# Patient Record
Sex: Male | Born: 1970 | Race: Black or African American | Hispanic: No | Marital: Married | Smoking: Current every day smoker
Health system: Southern US, Community
[De-identification: ages and names within clinical notes are randomized; demographics above are authoritative.]

## PROBLEM LIST (undated history)

## (undated) DIAGNOSIS — E785 Hyperlipidemia, unspecified: Secondary | ICD-10-CM

## (undated) DIAGNOSIS — G4730 Sleep apnea, unspecified: Secondary | ICD-10-CM

## (undated) HISTORY — DX: Sleep apnea, unspecified: G47.30

## (undated) HISTORY — DX: Hyperlipidemia, unspecified: E78.5

---

## 2012-09-15 ENCOUNTER — Other Ambulatory Visit (HOSPITAL_BASED_OUTPATIENT_CLINIC_OR_DEPARTMENT_OTHER): Payer: BC Managed Care – PPO

## 2012-09-15 ENCOUNTER — Ambulatory Visit (HOSPITAL_BASED_OUTPATIENT_CLINIC_OR_DEPARTMENT_OTHER): Payer: BC Managed Care – PPO

## 2012-09-15 ENCOUNTER — Ambulatory Visit (HOSPITAL_BASED_OUTPATIENT_CLINIC_OR_DEPARTMENT_OTHER)
Admission: RE | Admit: 2012-09-15 | Discharge: 2012-09-15 | Disposition: A | Discharge status: Home / Self Care | Payer: BC Managed Care – PPO | Source: Ambulatory Visit | Attending: Family Medicine | Admitting: Family Medicine

## 2012-09-15 ENCOUNTER — Ambulatory Visit (INDEPENDENT_AMBULATORY_CARE_PROVIDER_SITE_OTHER): Payer: BC Managed Care – PPO | Admitting: Family Medicine

## 2012-09-15 ENCOUNTER — Encounter: Payer: Self-pay | Admitting: Family Medicine

## 2012-09-15 VITALS — BP 110/76 | HR 70 | Temp 98.2°F | Ht 70.0 in | Wt 217.0 lb

## 2012-09-15 DIAGNOSIS — Z0000 Encounter for general adult medical examination without abnormal findings: Secondary | ICD-10-CM

## 2012-09-15 DIAGNOSIS — R1011 Right upper quadrant pain: Secondary | ICD-10-CM

## 2012-09-15 DIAGNOSIS — R319 Hematuria, unspecified: Secondary | ICD-10-CM

## 2012-09-15 DIAGNOSIS — D179 Benign lipomatous neoplasm, unspecified: Secondary | ICD-10-CM

## 2012-09-15 DIAGNOSIS — R1013 Epigastric pain: Secondary | ICD-10-CM

## 2012-09-15 LAB — HEPATIC FUNCTION PANEL
Albumin: 4.6 g/dL (ref 3.5–5.2)
Alkaline Phosphatase: 44 U/L (ref 39–117)

## 2012-09-15 LAB — BASIC METABOLIC PANEL
BUN: 11 mg/dL (ref 6–23)
Calcium: 9.5 mg/dL (ref 8.4–10.5)
Creatinine, Ser: 1 mg/dL (ref 0.4–1.5)
GFR: 112.89 mL/min (ref >60.00)
Glucose, Bld: 85 mg/dL (ref 70–99)
Potassium: 3.7 mEq/L (ref 3.5–5.1)

## 2012-09-15 LAB — POCT URINALYSIS DIPSTICK
Protein, UA: NEGATIVE
Spec Grav, UA: >=1.03
Urobilinogen, UA: 0.2

## 2012-09-15 LAB — LIPID PANEL
Cholesterol: 214 mg/dL — ABNORMAL HIGH (ref 0–200)
HDL: 43.2 mg/dL (ref >39.00)
VLDL: 24.6 mg/dL (ref 0.0–40.0)

## 2012-09-15 LAB — CBC WITH DIFFERENTIAL/PLATELET
Basophils Absolute: 0 10*3/uL (ref 0.0–0.1)
HCT: 42.9 % (ref 39.0–52.0)
Lymphs Abs: 1.6 10*3/uL (ref 0.7–4.0)
Monocytes Relative: 8.6 % (ref 3.0–12.0)
Neutrophils Relative %: 57.3 % (ref 43.0–77.0)
Platelets: 177 10*3/uL (ref 150.0–400.0)
RDW: 13 % (ref 11.5–14.6)

## 2012-09-15 LAB — H. PYLORI ANTIBODY, IGG: H Pylori IgG: NEGATIVE

## 2012-09-15 LAB — LDL CHOLESTEROL, DIRECT: Direct LDL: 137.8 mg/dL

## 2012-09-15 MED ORDER — GI COCKTAIL ~~LOC~~
30.0000 mL | Freq: Once | ORAL | Status: AC
  Administered 2012-09-15: 30 mL via ORAL

## 2012-09-15 NOTE — Patient Instructions (Signed)
Diet for Gastroesophageal Reflux Disease, Adult  Reflux (acid reflux) is when acid from your stomach flows up into the esophagus. When acid comes in contact with the esophagus, the acid causes irritation and soreness (inflammation) in the esophagus. When reflux happens often or so severely that it causes damage to the esophagus, it is called gastroesophageal reflux disease (GERD). Nutrition therapy can help ease the discomfort of GERD.  FOODS OR DRINKS TO AVOID OR LIMIT   Smoking or chewing tobacco. Nicotine is one of the most potent stimulants to acid production in the gastrointestinal tract.   Caffeinated and decaffeinated coffee and black tea.   Regular or low-calorie carbonated beverages or energy drinks (caffeine-free carbonated beverages are allowed).    Strong spices, such as black pepper, white pepper, red pepper, cayenne, curry powder, and chili powder.   Peppermint or spearmint.   Chocolate.   High-fat foods, including meats and fried foods. Extra added fats including oils, butter, salad dressings, and nuts. Limit these to less than 8 tsp per day.   Fruits and vegetables if they are not tolerated, such as citrus fruits or tomatoes.   Alcohol.   Any food that seems to aggravate your condition.  If you have questions regarding your diet, call your caregiver or a registered dietitian.  OTHER THINGS THAT MAY HELP GERD INCLUDE:    Eating your meals slowly, in a relaxed setting.   Eating 5 to 6 small meals per day instead of 3 large meals.   Eliminating food for a period of time if it causes distress.   Not lying down until 3 hours after eating a meal.   Keeping the head of your bed raised 6 to 9 inches (15 to 23 cm) by using a foam wedge or blocks under the legs of the bed. Lying flat may make symptoms worse.   Being physically active. Weight loss may be helpful in reducing reflux in overweight or obese adults.   Wear loose fitting clothing  EXAMPLE MEAL PLAN  This meal plan is approximately  2,000 calories based on [employer]My[employer] meal planning guidelines.  Breakfast    cup cooked oatmeal.   1 cup strawberries.   1 cup low-fat milk.   1 oz almonds.  Snack   1 cup cucumber slices.   6 oz yogurt (made from low-fat or fat-free milk).  Lunch   2 slice whole-wheat bread.   2 oz sliced turkey.   2 tsp mayonnaise.   1 cup blueberries.   1 cup snap peas.  Snack   6 whole-wheat crackers.   1 oz string cheese.  Dinner    cup brown rice.   1 cup mixed veggies.   1 tsp olive oil.   3 oz grilled fish.  Document Released: 04/19/2005 Document Revised: 07/12/2011 Document Reviewed: 03/05/2011  [employer] Patient Information 2013 [employer].

## 2012-09-15 NOTE — Progress Notes (Signed)
  Subjective:     Willie Lewis is a 42 y.o. male who presents for evaluation of abdominal pain. Onset was several years but recently worsened. Symptoms have been gradually worsening. The pain is described as burning and shooting, and is 6/10 in intensity. Pain is located in the epigastric region with radiation to back.  Aggravating factors: eating.  Alleviating factors: none. Associated symptoms: belching. The patient denies anorexia, arthralagias, chills, constipation, diarrhea, dysuria, fever, flatus, frequency, headache, hematochezia, hematuria, melena, myalgias, nausea, sweats and vomiting. Pt also c/o mass on R upper back --- x1 week.  Tender after touching it.   The patient's history has been marked as reviewed and updated as appropriate.  Review of Systems Pertinent items are noted in HPI.     Objective:    BP 110/76  Pulse 70  Temp(Src) 98.2 F (36.8 C) (Oral)  Ht 5\' 10"  (1.778 m)  Wt 217 lb (98.431 kg)  BMI 31.14 kg/m2  SpO2 97% General appearance: alert, cooperative, appears stated age and no distress Neck: no adenopathy, no JVD, supple, symmetrical, trachea midline and thyroid not enlarged, symmetric, no tenderness/mass/nodules Lungs: clear to auscultation bilaterally Abdomen: abnormal findings:  moderate tenderness in the epigastrium    Assessment:    Abdominal pain, likely secondary to gerd vs GB .    Plan:    The diagnosis was discussed with the patient and evaluation and treatment plans outlined. See orders for lab and imaging studies. Adhere to simple, bland diet. Initiate empiric trial of acid suppression; see orders. Further follow-up plans will be based on outcome of lab/imaging studies; see orders. Follow up as needed. refer to gi

## 2012-09-17 ENCOUNTER — Other Ambulatory Visit: Payer: Self-pay | Admitting: Family Medicine

## 2012-09-17 DIAGNOSIS — R1013 Epigastric pain: Secondary | ICD-10-CM

## 2012-09-17 LAB — URINE CULTURE
Colony Count: NO GROWTH
Organism ID, Bacteria: NO GROWTH

## 2012-09-18 ENCOUNTER — Other Ambulatory Visit: Payer: Self-pay | Admitting: *Deleted

## 2012-09-18 ENCOUNTER — Encounter: Payer: Self-pay | Admitting: Internal Medicine

## 2012-09-18 ENCOUNTER — Encounter: Payer: Self-pay | Admitting: *Deleted

## 2012-09-18 DIAGNOSIS — R1011 Right upper quadrant pain: Secondary | ICD-10-CM

## 2012-09-22 ENCOUNTER — Ambulatory Visit (INDEPENDENT_AMBULATORY_CARE_PROVIDER_SITE_OTHER): Payer: BC Managed Care – PPO | Admitting: Surgery

## 2012-09-22 ENCOUNTER — Encounter (INDEPENDENT_AMBULATORY_CARE_PROVIDER_SITE_OTHER): Payer: Self-pay | Admitting: Surgery

## 2012-09-22 VITALS — BP 122/72 | HR 76 | Temp 97.2°F | Resp 16 | Ht 70.0 in | Wt 215.6 lb

## 2012-09-22 DIAGNOSIS — D1779 Benign lipomatous neoplasm of other sites: Secondary | ICD-10-CM

## 2012-09-22 DIAGNOSIS — D171 Benign lipomatous neoplasm of skin and subcutaneous tissue of trunk: Secondary | ICD-10-CM | POA: Insufficient documentation

## 2012-09-22 NOTE — Progress Notes (Signed)
Patient ID: Willie Lewis, male   DOB: 1971/02/04, 42 y.o.   MRN: [id number]  Chief Complaint  Patient presents with  . New Evaluation    eval lipoma rt upper back    HPI Willie Lewis is a 42 y.o. male.  Referred by Dr. Loreen Freud for evaluation of lipoma of the back. HPI This is a healthy 42 year old male who presents with recent discovery of a palpable mass on his back. This does cause occasional tenderness which radiates up into the base of his neck. This has not become infected. It has not enlarged since he discovered it 2 weeks ago.  The patient is also undergoing a GI workup for persistent epigastric pain and burning. This pain radiates through to his back. He also has some mild nausea. He has an appointment with Dr. Erick Blinks in the near future for possible endoscopy. He may possibly undergo workup for gallbladder disease as well.  History reviewed. No pertinent past medical history.  History reviewed. No pertinent past surgical history.  History reviewed. No pertinent family history.  Social History History  Substance Use Topics  . Smoking status: Former Smoker    Types: Cigarettes    Quit date: 07/01/2012  . Smokeless tobacco: Never Used  . Alcohol Use: 1.2 oz/week    2 Cans of beer per week    No Known Allergies  Current Outpatient Prescriptions  Medication Sig Dispense Refill  . Multiple Vitamin (MULTIVITAMIN) tablet Take 1 tablet by mouth daily.      Marland Kitchen omeprazole (PRILOSEC) 10 MG capsule Take 10 mg by mouth daily.       No current facility-administered medications for this visit.    Review of Systems Review of Systems  Constitutional: Negative for fever, chills and unexpected weight change.  HENT: Negative for hearing loss, congestion, sore throat, trouble swallowing and voice change.   Eyes: Negative for visual disturbance.  Respiratory: Negative for cough and wheezing.   Cardiovascular: Negative for chest pain, palpitations and leg swelling.   Gastrointestinal: Positive for nausea, abdominal pain and abdominal distention. Negative for vomiting, diarrhea, constipation, blood in stool, anal bleeding and rectal pain.  Genitourinary: Negative for hematuria and difficulty urinating.  Musculoskeletal: Positive for back pain. Negative for arthralgias.  Skin: Negative for rash and wound.  Neurological: Negative for seizures, syncope, weakness and headaches.  Hematological: Negative for adenopathy. Does not bruise/bleed easily.  Psychiatric/Behavioral: Negative for confusion.    Blood pressure 122/72, pulse 76, temperature 97.2 F (36.2 C), temperature source Temporal, resp. rate 16, height 5\' 10"  (1.778 m), weight 215 lb 9.6 oz (97.796 kg).  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  We will wait until his GI work-up is complete.  If he should possibly need surgery for his GI symptoms, we can remove the lipoma at the same time.  He will call us back after he has seen Dr. Rhea Belton.         Willie Attwood K. 09/22/2012, 10:16 AM

## 2012-09-22 NOTE — Patient Instructions (Signed)
When Dr. Rhea Belton has completed his work-up of your GI problems, call us at [phone] to let us know that you want to schedule your surgery for lipoma removal.

## 2012-10-05 ENCOUNTER — Encounter: Payer: Self-pay | Admitting: Internal Medicine

## 2012-10-10 ENCOUNTER — Ambulatory Visit: Payer: BC Managed Care – PPO | Admitting: Internal Medicine

## 2012-11-06 ENCOUNTER — Encounter: Payer: Self-pay | Admitting: Internal Medicine

## 2012-11-13 ENCOUNTER — Ambulatory Visit (INDEPENDENT_AMBULATORY_CARE_PROVIDER_SITE_OTHER): Payer: BC Managed Care – PPO | Admitting: Internal Medicine

## 2012-11-13 ENCOUNTER — Encounter: Payer: Self-pay | Admitting: Internal Medicine

## 2012-11-13 VITALS — BP 102/62 | HR 64 | Ht 70.0 in | Wt 201.2 lb

## 2012-11-13 DIAGNOSIS — R1013 Epigastric pain: Secondary | ICD-10-CM

## 2012-11-13 NOTE — Progress Notes (Signed)
Patient ID: Willie Lewis, male   DOB: 03/01/1971, 42 y.o.   MRN: [id number] HPI: Willie Lewis is a 42 year old male with little past medical history who is seen in consultation at the request of Dr. Laury Axon or evaluation of epigastric abdominal pain. The patient reports that he was having on-and-off epigastric abdominal pain which was dull and aching in nature, worsened by eating and at times radiating to his back over the past 6-12 months. He reports this has resolved completely at this point. He is able to eat and drink what he wishes without pain. No nausea or vomiting. No dysphagia or odynophagia. No early satiety. He reports he has lost about 10 pounds, which is back to his normal weight and he feels that this is related to becoming more active with a new job and eating better. He feels that the weight loss is responsible for his improved pain. He did take Prilosec 20 mg daily for one to 2 weeks which he felt also may have helped. He is not taking PPI now.  No fevers or chills. No change in bowel habits. He is having 1-2 formed bowel movements daily without blood or melena. No family history of GI-related malignancy  Patient Active Problem List   Diagnosis Date Noted  . Lipoma of back - 3 cm subcutaneous 09/22/2012    History reviewed. No pertinent past surgical history.  Current Outpatient Prescriptions  Medication Sig Dispense Refill  . Multiple Vitamin (MULTIVITAMIN) tablet Take 1 tablet by mouth daily.       No current facility-administered medications for this visit.    No Known Allergies  History reviewed. No pertinent family history.  History  Substance Use Topics  . Smoking status: Former Smoker    Types: Cigarettes    Quit date: 07/01/2012  . Smokeless tobacco: Never Used  . Alcohol Use: No    ROS: As per history of present illness, otherwise negative  BP 102/62  Pulse 64  Ht 5\' 10"  (1.778 m)  Wt 201 lb 3.2 oz (91.264 kg)  BMI 28.87 kg/m2 Constitutional:  Well-developed and well-nourished. No distress. HEENT: Normocephalic and atraumatic. Oropharynx is clear and moist. No oropharyngeal exudate. Conjunctivae are normal.  No scleral icterus. Neck: Neck supple. Trachea midline. Cardiovascular: Normal rate, regular rhythm and intact distal pulses. No M/R/G Pulmonary/chest: Effort normal and breath sounds normal. No wheezing, rales or rhonchi. Abdominal: Soft, nontender, nondistended. Bowel sounds active throughout. There are no masses palpable. No hepatosplenomegaly. Extremities: no clubbing, cyanosis, or edema Lymphadenopathy: No cervical adenopathy noted. Neurological: Alert and oriented to person place and time. Skin: Skin is warm and dry. No rashes noted. Psychiatric: Normal mood and affect. Behavior is normal.  RELEVANT LABS AND IMAGING: CBC    Component Value Date/Time   WBC 4.9 09/15/2012 0945   RBC 4.44 09/15/2012 0945   HGB 14.8 09/15/2012 0945   HCT 42.9 09/15/2012 0945   PLT 177.0 09/15/2012 0945   MCV 96.6 09/15/2012 0945   MCHC 34.5 09/15/2012 0945   RDW 13.0 09/15/2012 0945   LYMPHSABS 1.6 09/15/2012 0945   MONOABS 0.4 09/15/2012 0945   EOSABS 0.1 09/15/2012 0945   BASOSABS 0.0 09/15/2012 0945    CMP     Component Value Date/Time   NA 139 09/15/2012 0945   K 3.7 09/15/2012 0945   CL 106 09/15/2012 0945   CO2 27 09/15/2012 0945   GLUCOSE 85 09/15/2012 0945   BUN 11 09/15/2012 0945   CREATININE 1.0 09/15/2012 0945   CALCIUM  9.5 09/15/2012 0945   PROT 7.0 09/15/2012 0945   ALBUMIN 4.6 09/15/2012 0945   AST 30 09/15/2012 0945   ALT 24 09/15/2012 0945   ALKPHOS 44 09/15/2012 0945   BILITOT 0.8 09/15/2012 0945   H pylori IgG neg  COMPLETE ABDOMINAL ULTRASOUND -- 09/18/2012   Comparison:  None.   Findings:   Gallbladder:  The gallbladder is well distended and demonstrates no evidence for intraluminal stones or sludge.  No pericholecystic fluid or gallbladder wall thickening is seen.  Evaluation for a sonographic Murphy's sign was  negative   Common bile duct:  Measures 3 mm in diameter and has a normal appearance   Liver:  Appears homogeneous in echotexture without focal parenchymal abnormality or signs of intrahepatic ductal dilatation   IVC:  The proximal portion appears normal   Pancreas:  The head and body have a normal size and echotexture. The tail is obscured by overlying gas and incompletely assessed   Spleen:  Has a sagittal length of 6.4 cm.  No focal parenchymal abnormality is seen.   Right Kidney:  Measures 11.4 cm in length.  No focal parenchymal abnormality or signs of hydronephrosis is evident   Left Kidney:  Has a sagittal length of 11.5 cm.  No focal parenchymal abnormality or signs of hydronephrosis is evident   Abdominal aorta:  Portions of the aorta are obscured by overlying gas.  The visualized portion has a maximal caliber of 2.7 cm and shows no evidence for aneurysmal dilatation.   IMPRESSION: Incomplete assessment of the pancreas and abdominal aorta. Otherwise unremarkable abdominal ultrasound   ASSESSMENT/PLAN: 42 year old male with little past medical history who is seen in consultation at the request of Dr. Laury Axon or evaluation of epigastric abdominal pain, now resolved.  1.  Resolved epigastric pain -- he has no further upper abdominal complaints including no abdominal pain. There are certainly no alarm symptoms at this time. At this time I do not feel that any other workup is necessary. If his pain returns, we could consider another trial PPI or upper endoscopy. His H. pylori IgG was negative and therefore no antibiotic treatment is felt necessary. He is asked to call me if his symptoms return. She voices understanding  2.  CRC screening -- he asked about colonoscopy for colorectal cancer screening and we discussed the current guidelines. He is average risk and I recommended that he begin colonoscopy screening at age 39 (we discussed new guidelines for African Americans which are  indicating earlier screening beginning around age 46).  Return PRN

## 2012-11-13 NOTE — Patient Instructions (Addendum)
Follow up as needed                                               We are excited to introduce MyChart, a new best-in-class service that provides you online access to important information in your electronic medical record. We want to make it easier for you to view your health information - all in one secure location - when and where you need it. We expect MyChart will enhance the quality of care and service we provide.  When you register for MyChart, you can:    View your test results.    Request appointments and receive appointment reminders via email.    Request medication renewals.    View your medical history, allergies, medications and immunizations.    Communicate with your physician's office through a password-protected site.    Conveniently print information such as your medication lists.  To find out if MyChart is right for you, please talk to a member of our clinical staff today. We will gladly answer your questions about this free health and wellness tool.  If you are age 18 or older and want a member of your family to have access to your record, you must provide written consent by completing a proxy form available at our office. Please speak to our clinical staff about guidelines regarding accounts for patients younger than age 18.  As you activate your MyChart account and need any technical assistance, please call the MyChart technical support line at [phone] ([phone]) or email your question to [email or web address]. If you email your question(s), please include your name, a return phone number and the best time to reach you.  If you have non-urgent health-related questions, you can send a message to our office through MyChart at [email or web address]. If you have a medical emergency, call 911.  Thank you for using MyChart as your new health and wellness resource!   MyChart licensed from [employer],  1999-2010. Patents Pending.   

## 2014-07-31 IMAGING — US US ABDOMEN COMPLETE
1 series · 13 of 25 positions shown · non-contrast
Comparison: None.

CLINICAL DATA: Epigastric pain for 6 months.

COMPLETE ABDOMINAL ULTRASOUND

[Series 1: us abdomen complete · 0.35mm/px · 13 of 64 slices shown]
[im 1/64]
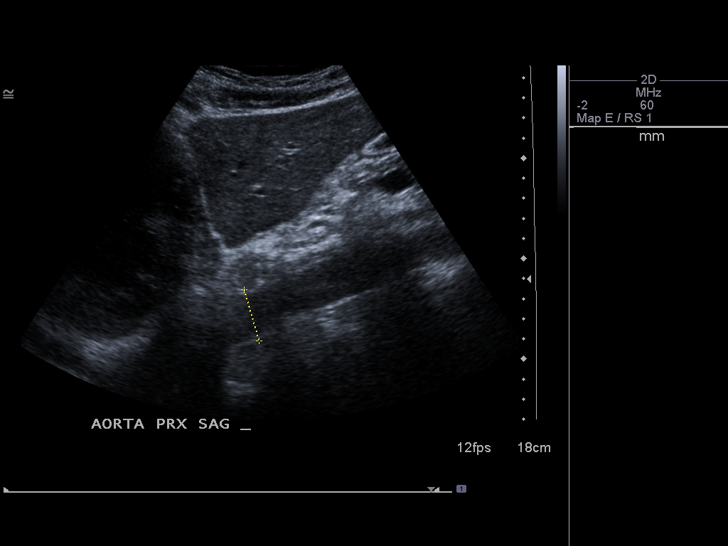
[im 6/64]
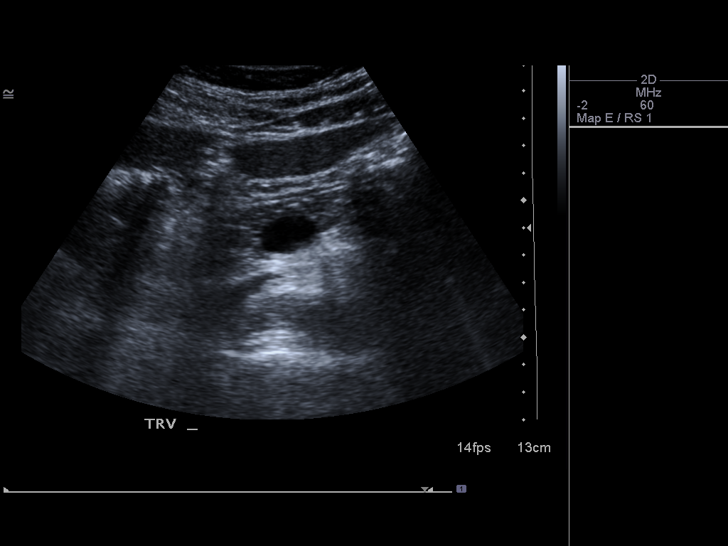
[im 11/64]
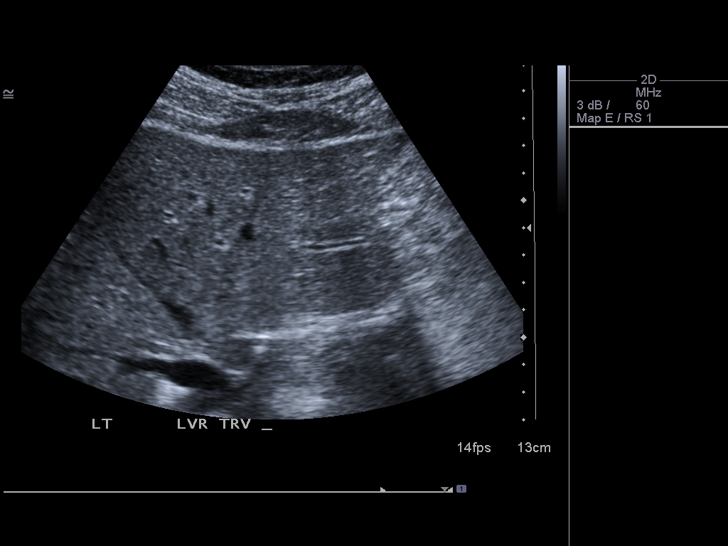
[im 16/64]
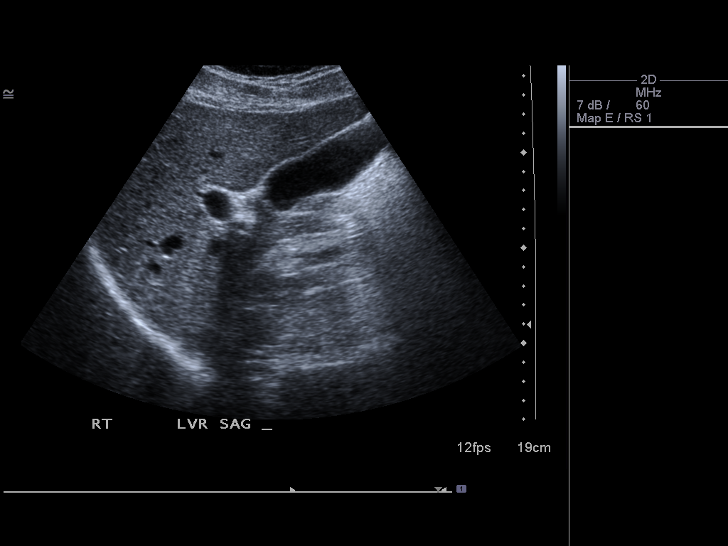
[im 22/64]
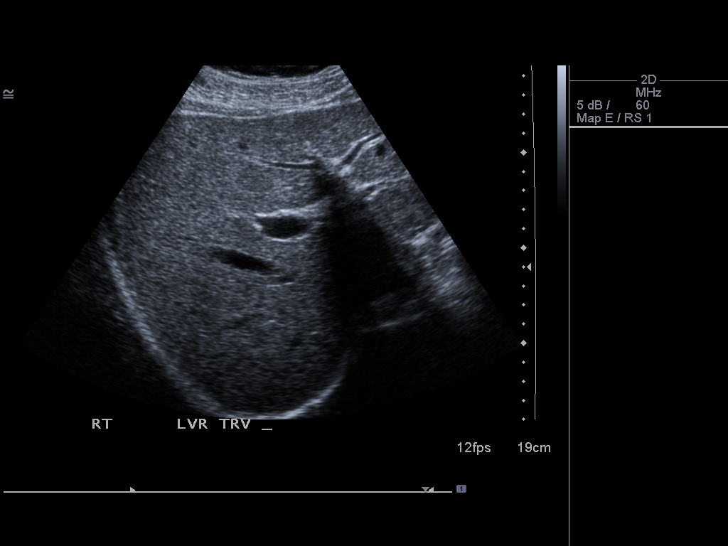
[im 27/64]
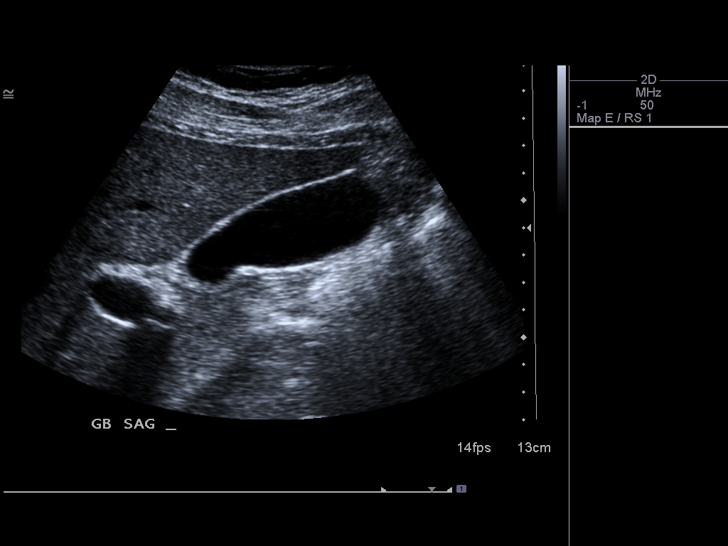
[im 32/64]
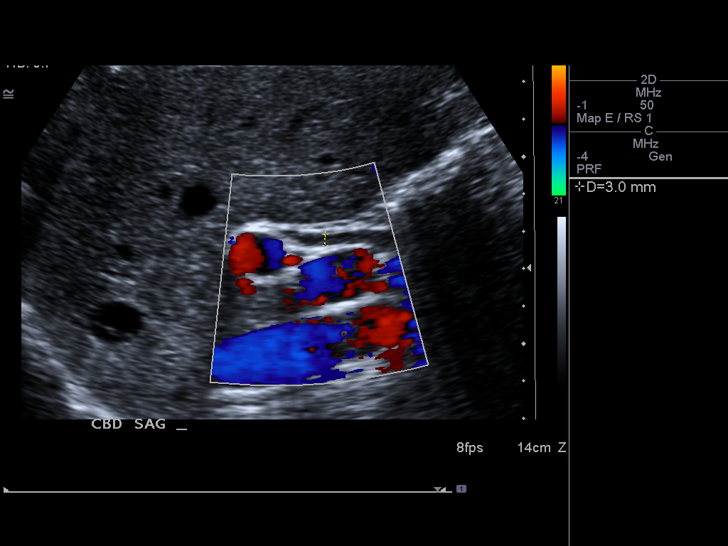
[im 37/64]
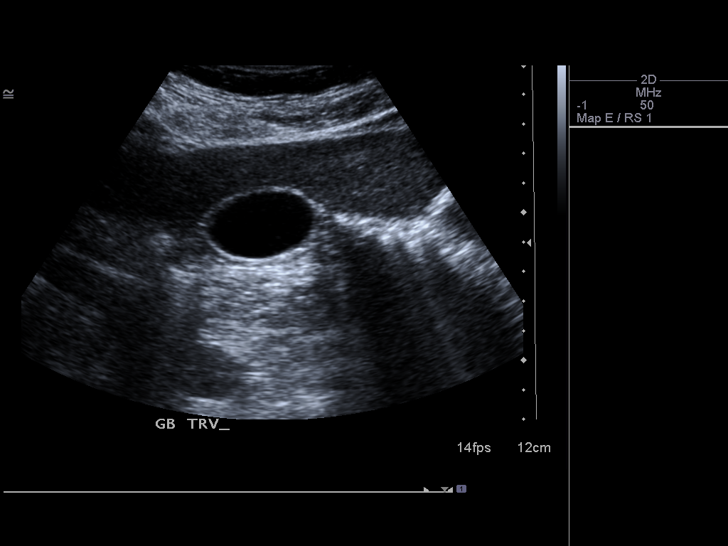
[im 43/64]
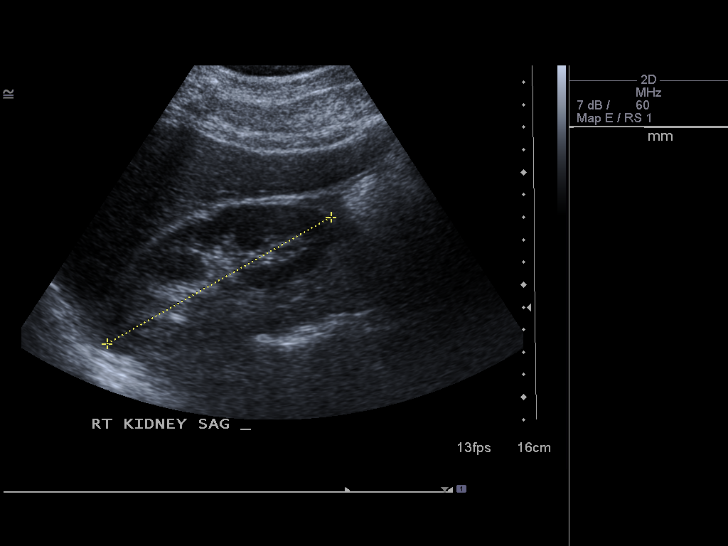
[im 48/64]
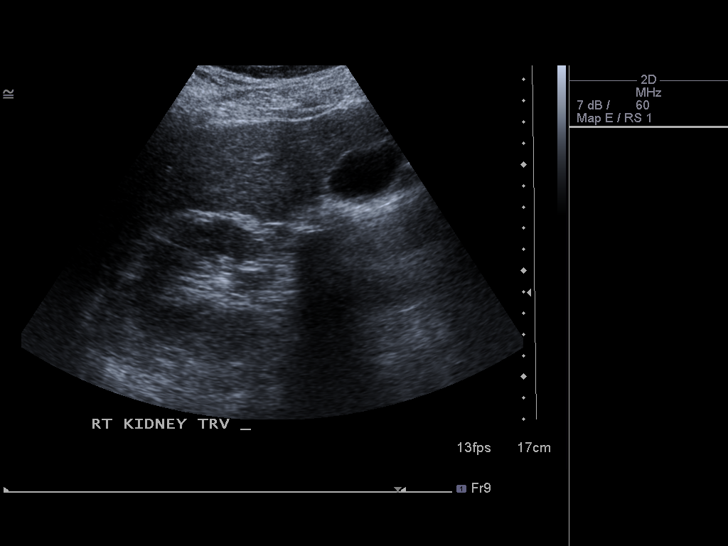
[im 53/64]
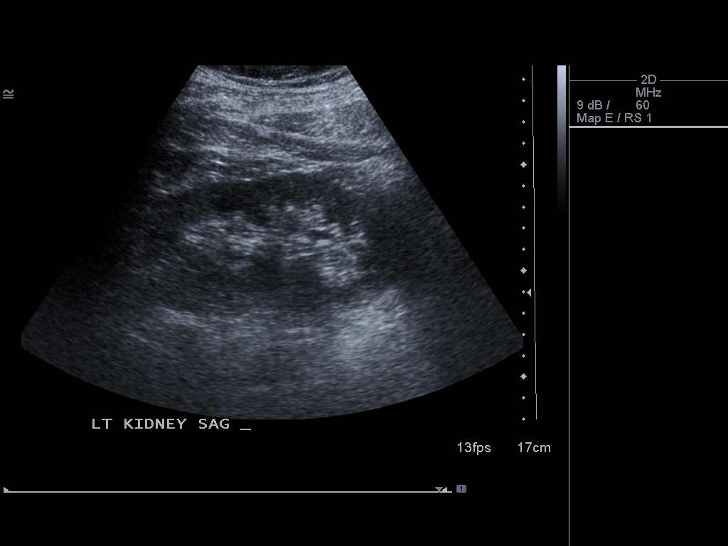
[im 58/64]
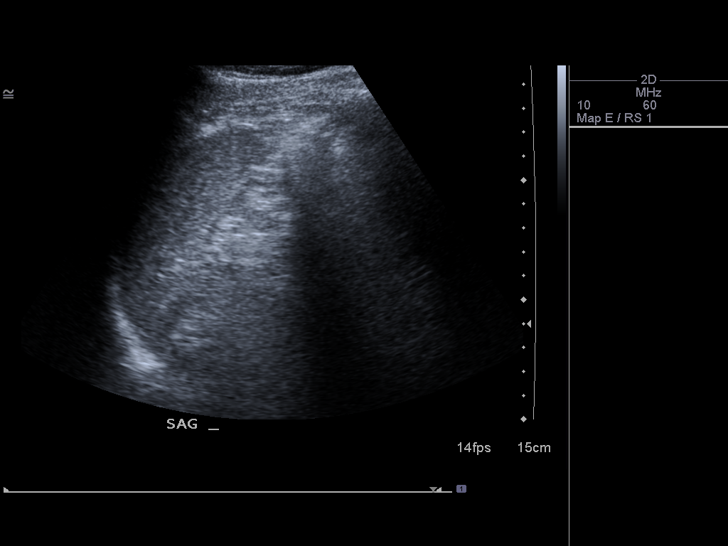
[im 64/64]
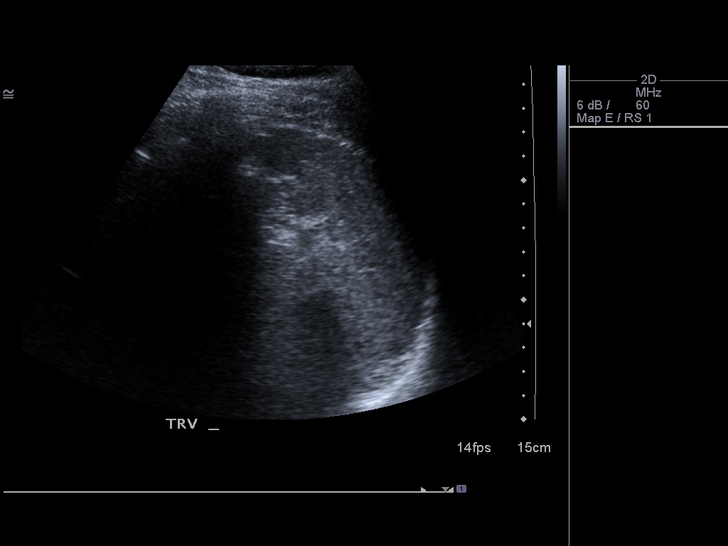

[13 of 25 positions shown; findings below may reference images not displayed]

FINDINGS: Gallbladder:  The gallbladder is well distended and demonstrates no
evidence for intraluminal stones or sludge.  No pericholecystic
fluid or gallbladder wall thickening is seen.  Evaluation for a
sonographic Murphy's sign was negative

Common bile duct:  Measures 3 mm in diameter and has a normal
appearance

Liver:  Appears homogeneous in echotexture without focal
parenchymal abnormality or signs of intrahepatic ductal dilatation

IVC:  The proximal portion appears normal

Pancreas:  The head and body have a normal size and echotexture.
The tail is obscured by overlying gas and incompletely assessed

Spleen:  Has a sagittal length of 6.4 cm.  No focal parenchymal
abnormality is seen.

Right Kidney:  Measures 11.4 cm in length.  No focal parenchymal
abnormality or signs of hydronephrosis is evident

Left Kidney:  Has a sagittal length of 11.5 cm.  No focal
parenchymal abnormality or signs of hydronephrosis is evident

Abdominal aorta:  Portions of the aorta are obscured by overlying
gas.  The visualized portion has a maximal caliber of 2.7 cm and
shows no evidence for aneurysmal dilatation.
IMPRESSION: Incomplete assessment of the pancreas and abdominal aorta.
Otherwise unremarkable abdominal ultrasound

## 2016-03-23 ENCOUNTER — Other Ambulatory Visit (HOSPITAL_BASED_OUTPATIENT_CLINIC_OR_DEPARTMENT_OTHER): Payer: Self-pay | Admitting: Physical Medicine and Rehabilitation

## 2016-03-23 ENCOUNTER — Ambulatory Visit (HOSPITAL_BASED_OUTPATIENT_CLINIC_OR_DEPARTMENT_OTHER)
Admission: RE | Admit: 2016-03-23 | Discharge: 2016-03-23 | Disposition: A | Discharge status: Home / Self Care | Payer: BLUE CROSS/BLUE SHIELD | Source: Ambulatory Visit | Attending: Physical Medicine and Rehabilitation | Admitting: Physical Medicine and Rehabilitation

## 2016-03-23 DIAGNOSIS — M546 Pain in thoracic spine: Secondary | ICD-10-CM | POA: Diagnosis not present

## 2016-03-23 DIAGNOSIS — M5489 Other dorsalgia: Secondary | ICD-10-CM

## 2016-03-23 DIAGNOSIS — M47897 Other spondylosis, lumbosacral region: Secondary | ICD-10-CM | POA: Diagnosis not present

## 2018-02-05 IMAGING — DX DG LUMBAR SPINE COMPLETE W/ BEND
7 series · 7 of 7 positions shown · non-contrast
Comparison: None.

CLINICAL DATA: Mid thoracic pain for 3 months

EXAM:
LUMBAR SPINE - COMPLETE WITH BENDING VIEWS

[l-spine ap]
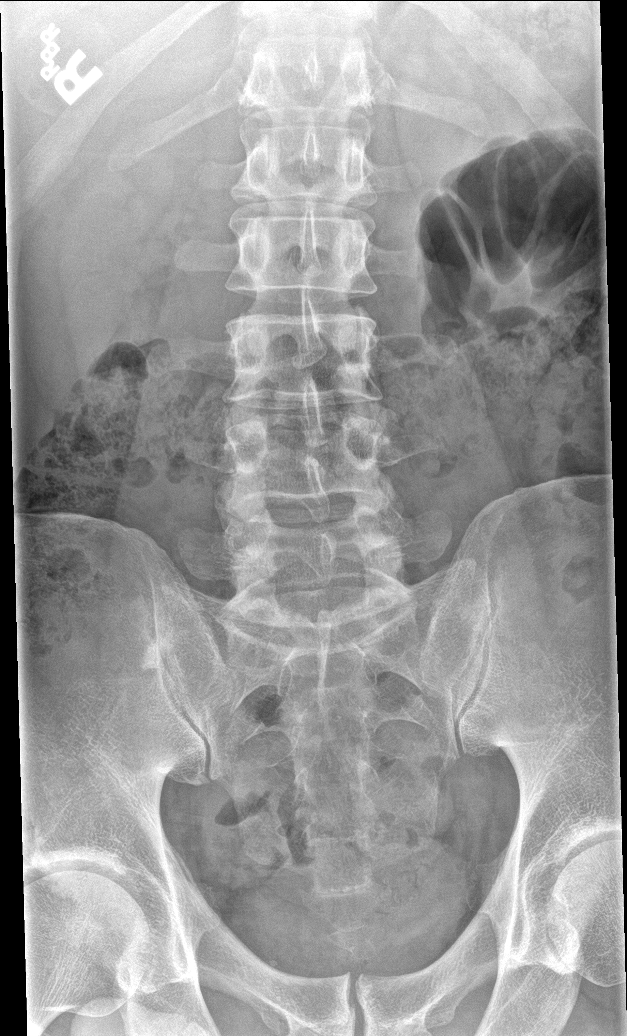

[l-spine obl (1 of 2)]
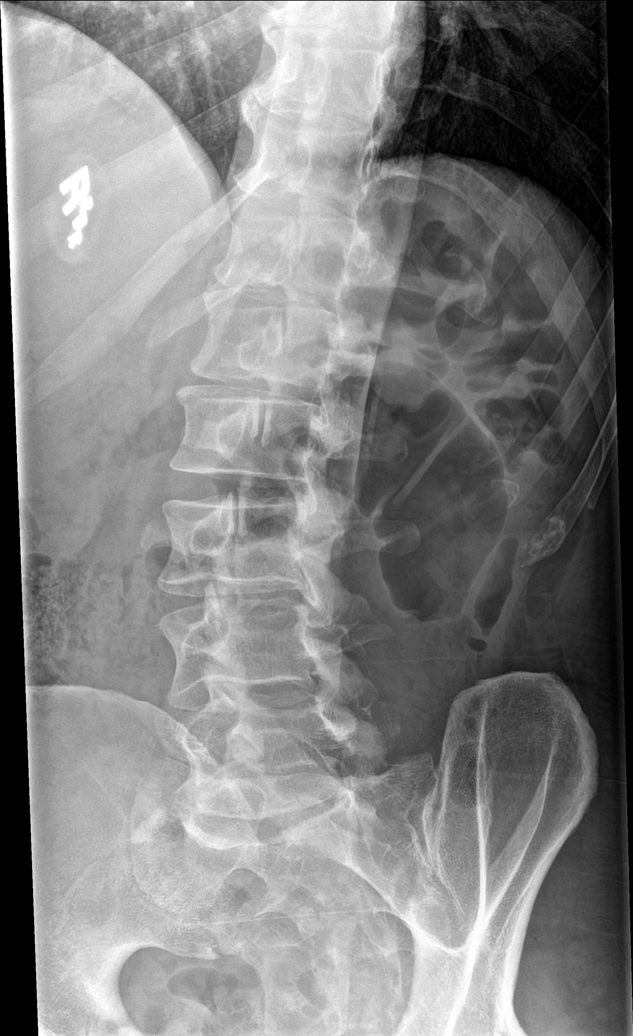

[l-spine obl (2 of 2)]
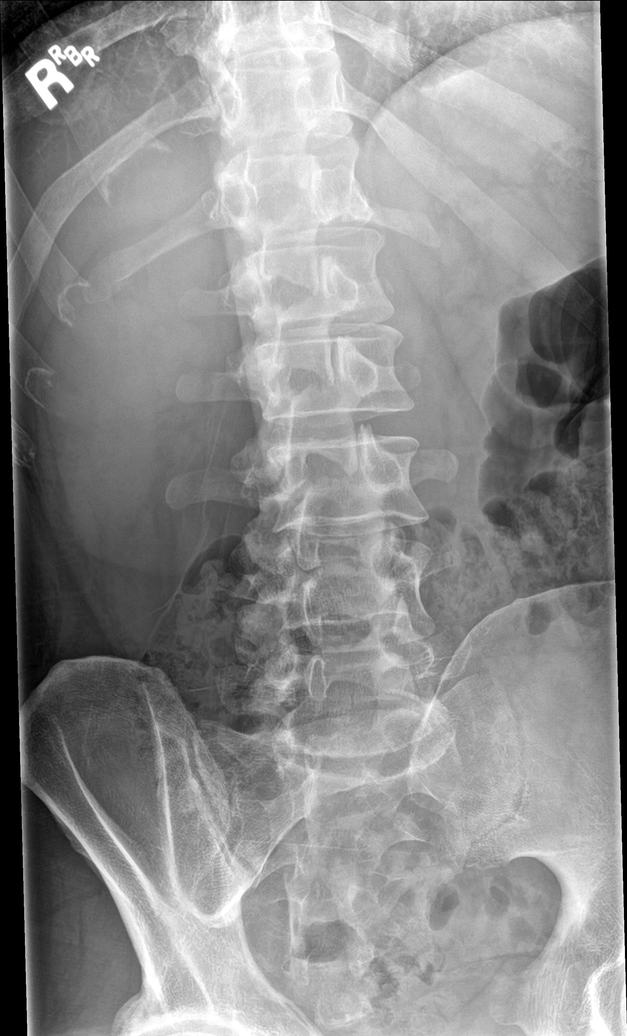

[l-spine lat]
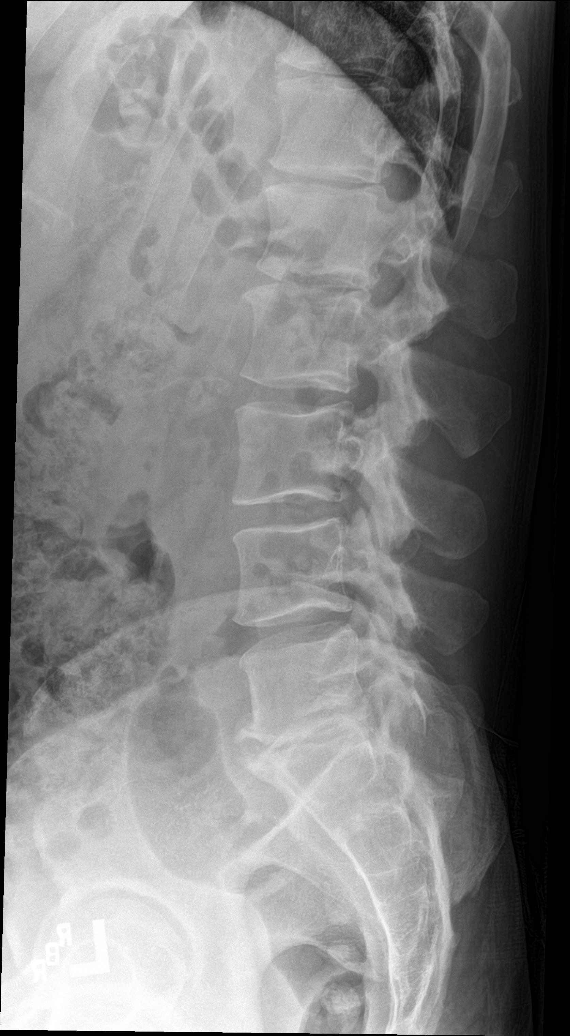

[l-spine flex]
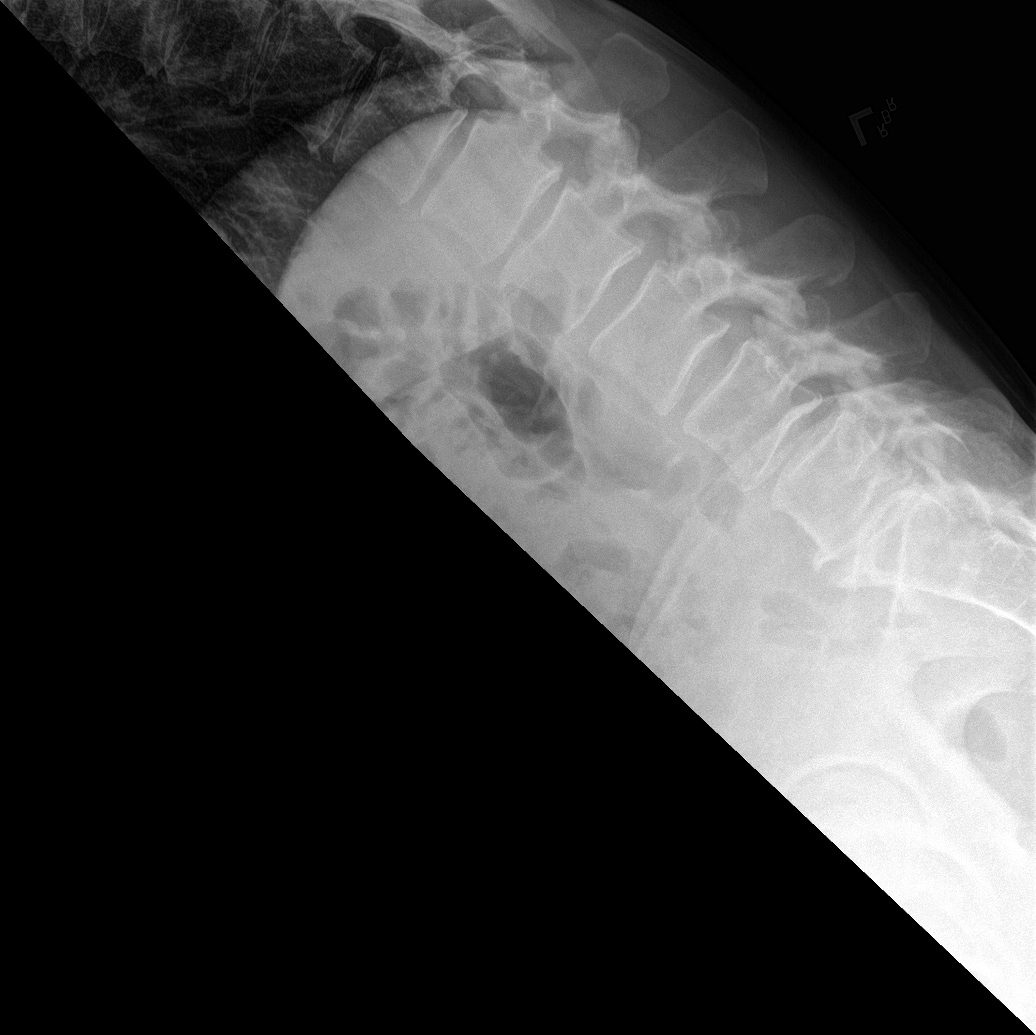

[l-spine ext]
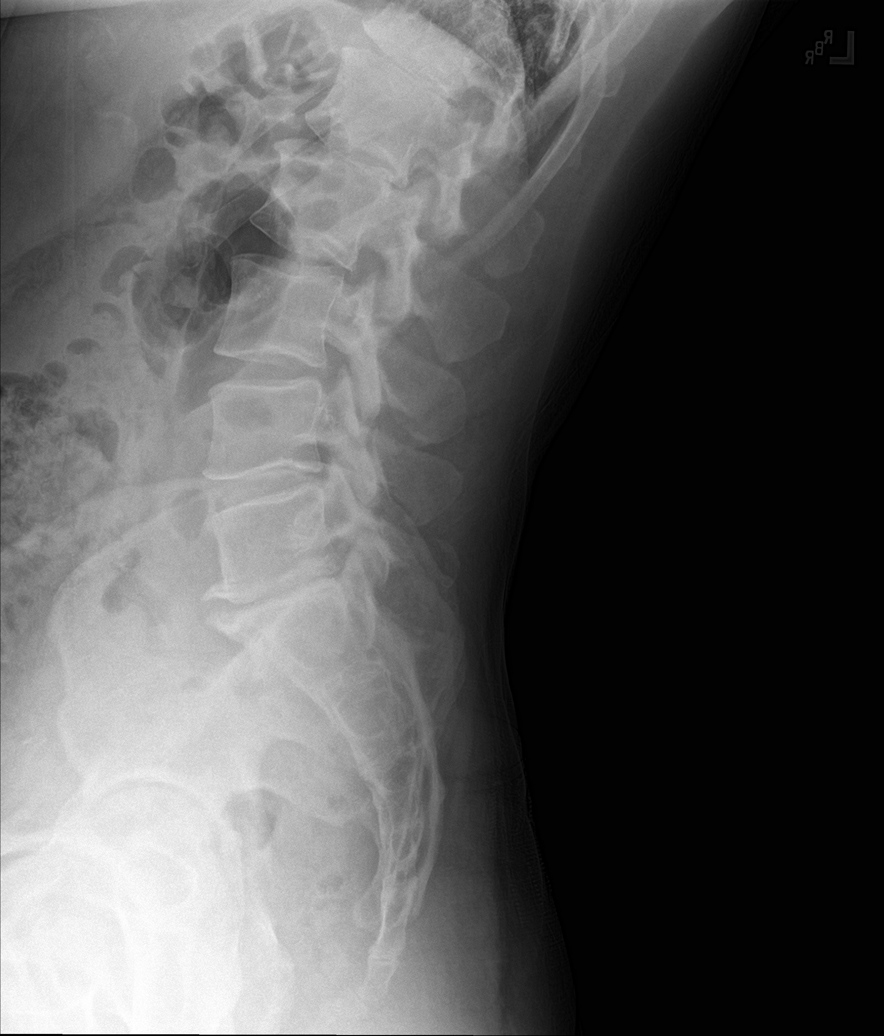

[l-spine spot]
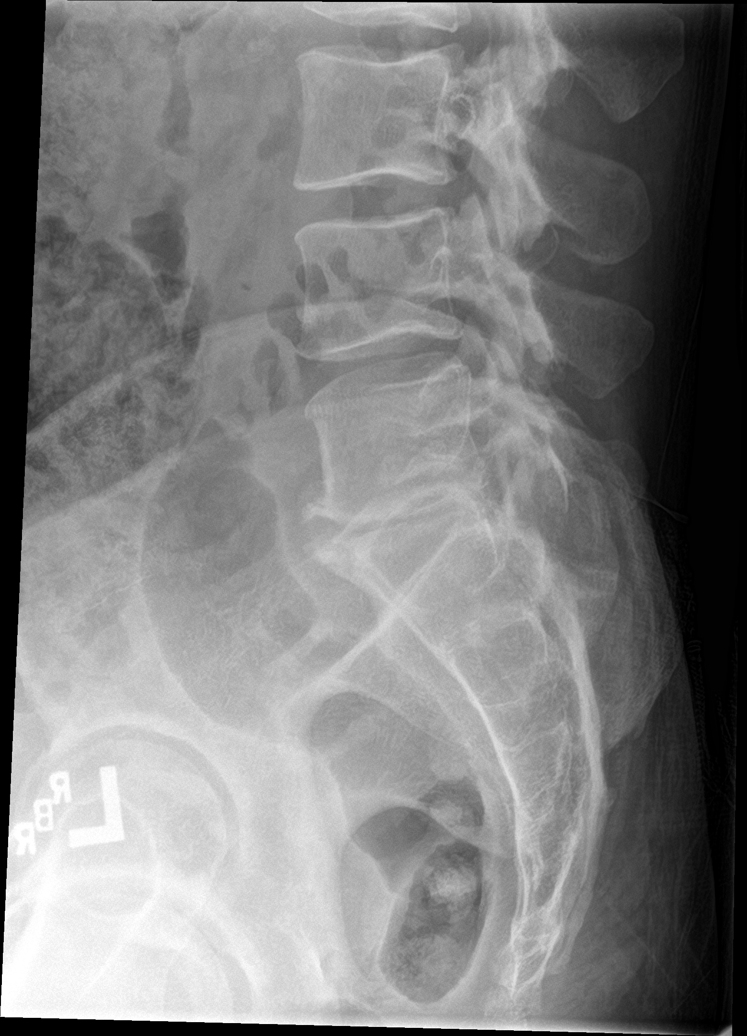

[7 of 7 positions shown; findings below may reference images not displayed]

FINDINGS: Five non rib-bearing lumbar type vertebra. Moderate-to-marked
narrowing at L5-S1 with endplate changes and osteophytes. No
fracture. Vertebral body heights are maintained. Mild degenerative
changes from T11 through L2. Alignment grossly maintained with
bending and flexion.
IMPRESSION: 1. No acute osseous abnormality
2. Multilevel degenerative changes most notable at L5-S1.

## 2018-07-04 ENCOUNTER — Emergency Department (HOSPITAL_BASED_OUTPATIENT_CLINIC_OR_DEPARTMENT_OTHER): Payer: BLUE CROSS/BLUE SHIELD

## 2018-07-04 ENCOUNTER — Emergency Department (HOSPITAL_BASED_OUTPATIENT_CLINIC_OR_DEPARTMENT_OTHER)
Admission: EM | Admit: 2018-07-04 | Discharge: 2018-07-04 | Disposition: A | Discharge status: Home / Self Care | Payer: BLUE CROSS/BLUE SHIELD | Attending: Emergency Medicine | Admitting: Emergency Medicine

## 2018-07-04 ENCOUNTER — Other Ambulatory Visit: Payer: Self-pay

## 2018-07-04 ENCOUNTER — Encounter (HOSPITAL_BASED_OUTPATIENT_CLINIC_OR_DEPARTMENT_OTHER): Payer: Self-pay | Admitting: Emergency Medicine

## 2018-07-04 DIAGNOSIS — F17210 Nicotine dependence, cigarettes, uncomplicated: Secondary | ICD-10-CM | POA: Diagnosis not present

## 2018-07-04 DIAGNOSIS — R05 Cough: Secondary | ICD-10-CM | POA: Insufficient documentation

## 2018-07-04 DIAGNOSIS — R059 Cough, unspecified: Secondary | ICD-10-CM

## 2018-07-04 DIAGNOSIS — Z720 Tobacco use: Secondary | ICD-10-CM

## 2018-07-04 MED ORDER — ALBUTEROL SULFATE HFA 108 (90 BASE) MCG/ACT IN AERS
1.0000 | INHALATION_SPRAY | Freq: Once | RESPIRATORY_TRACT | Status: AC
  Administered 2018-07-04: 2 via RESPIRATORY_TRACT
  Filled 2018-07-04: qty 6.7

## 2018-07-04 MED ORDER — IPRATROPIUM-ALBUTEROL 0.5-2.5 (3) MG/3ML IN SOLN
3.0000 mL | Freq: Four times a day (QID) | RESPIRATORY_TRACT | Status: DC
  Administered 2018-07-04: 3 mL via RESPIRATORY_TRACT
  Filled 2018-07-04: qty 3

## 2018-07-04 MED ORDER — FLUTICASONE PROPIONATE 50 MCG/ACT NA SUSP: 1.0000 | Freq: Every day | NASAL | 0 refills | Status: AC

## 2018-07-04 MED ORDER — PREDNISONE 50 MG PO TABS: 50.0000 mg | ORAL_TABLET | Freq: Every day | ORAL | 0 refills | Status: AC

## 2018-07-04 NOTE — ED Provider Notes (Signed)
[facility] Provider Note   CSN: [id number] Arrival date & time: 07/04/18  1551    History   Chief Complaint Chief Complaint  Patient presents with  . Cough    HPI Willie Lewis is a 48 y.o. male with a hx of tobacco abuse, hyperlipidemia, and sleep apnea who presents to the ED with complaints of cough x 1 week. Patient reports cough is productive with yellow to brown sputum production associated with "lung pain" with coughing- otherwise no chest pain, congestion, sneezing, ear pain, and difficulty breathing through the nose- he does not feel short of breath necessarily (despite triage note). He notes subjective fever/chills. No specific alleviating/aggravating factors. Has not tried intervention PTA. Seen by UC 3 days ago- thought to be viral process.Denies leg pain/swelling, hemoptysis, recent surgery/trauma, recent long travel, hormone use, personal hx of cancer, or hx of DVT/PE. He notes that he has been "vaping hard for the last 90 days." Vaping tobacco & THC products.       HPI  Past Medical History:  Diagnosis Date  . Hyperlipemia   . Sleep apnea     Patient Active Problem List   Diagnosis Date Noted  . Lipoma of back - 3 cm subcutaneous 09/22/2012    History reviewed. No pertinent surgical history.      Home Medications    Prior to Admission medications   Medication Sig Start Date End Date Taking? Authorizing Provider  Multiple Vitamin (MULTIVITAMIN) tablet Take 1 tablet by mouth daily.    [provider]    Family History No family history on file.  Social History Social History   Tobacco Use  . Smoking status: Current Every Day Smoker    Types: E-cigarettes    Last attempt to quit: 07/01/2012    Years since quitting: 6.0  . Smokeless tobacco: Never Used  Substance Use Topics  . Alcohol use: No    Alcohol/week: 2.0 standard drinks    Types: 2 Cans of beer per week  . Drug use: No     Allergies   Patient  has no known allergies.   Review of Systems Review of Systems  Constitutional: Positive for chills and fever (subjective).  HENT: Positive for congestion, ear pain and sneezing. Negative for sore throat.   Respiratory: Positive for cough.   Cardiovascular: Positive for chest pain. Negative for palpitations and leg swelling.  Gastrointestinal: Negative for abdominal pain and diarrhea.  Musculoskeletal: Negative for myalgias.  Neurological: Negative for syncope.  All other systems reviewed and are negative.    Physical Exam Updated Vital Signs BP 103/80 (BP Location: Left Arm)   Pulse 97   Temp 98.9 F (37.2 C) (Oral)   Resp 18   Ht 5\' 10"  (1.778 m)   Wt 88.5 kg   SpO2 98%   BMI 27.98 kg/m   Physical Exam Vitals signs and nursing note reviewed.  Constitutional:      General: He is not in acute distress.    Appearance: He is well-developed. He is not toxic-appearing.  HENT:     Head: Normocephalic and atraumatic.     Right Ear: Tympanic membrane, ear canal and external ear normal.     Left Ear: Tympanic membrane, ear canal and external ear normal.     Nose: Mucosal edema and congestion present.     Right Sinus: No maxillary sinus tenderness or frontal sinus tenderness.     Left Sinus: No maxillary sinus tenderness or frontal sinus tenderness.  Comments: Swollen boggy turbinates in bilateral nares.     Mouth/Throat:     Mouth: Mucous membranes are moist.     Pharynx: Oropharynx is clear. Uvula midline. No pharyngeal swelling or oropharyngeal exudate.     Tonsils: No tonsillar exudate.  Eyes:     General:        Right eye: No discharge.        Left eye: No discharge.     Conjunctiva/sclera: Conjunctivae normal.  Neck:     Musculoskeletal: Neck supple.  Cardiovascular:     Rate and Rhythm: Normal rate and regular rhythm.  Pulmonary:     Effort: No tachypnea or respiratory distress.     Breath sounds: Wheezing (diffuse expiratory) present. No rhonchi or rales.    Abdominal:     General: There is no distension.     Palpations: Abdomen is soft.     Tenderness: There is no abdominal tenderness.  Skin:    General: Skin is warm and dry.     Findings: No rash.  Neurological:     Mental Status: He is alert.     Comments: Clear speech.   Psychiatric:        Behavior: Behavior normal.      ED Treatments / Results  Labs (all labs ordered are listed, but only abnormal results are displayed) Labs Reviewed - No data to display  EKG None  Radiology Dg Chest 2 View  Result Date: 07/04/2018 CLINICAL DATA:  Cough and dyspnea.  History of vaping for 90 days. EXAM: CHEST - 2 VIEW COMPARISON:  Thoracic spine radiographs 03/23/2016 FINDINGS: The heart size and mediastinal contours are within normal limits. Both lungs are clear. Opacity at the right lung base is likely due to overlap of pulmonary vasculature and ribs. The visualized skeletal structures are unremarkable. IMPRESSION: No active cardiopulmonary disease. Electronically Signed   By: Ashley Royalty M.D.   On: 07/04/2018 18:09    Procedures Procedures (including critical care time)  Medications Ordered in ED Medications  ipratropium-albuterol (DUONEB) 0.5-2.5 (3) MG/3ML nebulizer solution 3 mL (3 mLs Nebulization Given 07/04/18 1638)  albuterol (PROVENTIL HFA;VENTOLIN HFA) 108 (90 Base) MCG/ACT inhaler 1-2 puff (has no administration in time range)     Initial Impression / Assessment and Plan / ED Course  I have reviewed the triage vital signs and the nursing notes.  Pertinent labs & imaging results that were available during my care of the patient were reviewed by me and considered in my medical decision making (see chart for details).   Patient presents to the ED with URI sxs. Nontoxic appearing, vitals WNL. Exam notable for boggy/swollen turbinates, congestion, and diffuse end expiratory wheeze, does not appear in respiratory distress. Will obtain CXR & trial duoneb.   CXR: Negative for  infiltrate, edema, effusion, or pneumothorax.   Patient feeling improved following neb tx. Lungs CTA. Maintaining SpO2.   Lungs without focal adventitious sounds on repeat lungs exam, CXR negative for infiltrate- doubt pneumonia. No sinus tenderness, afebrile, doubt acute bacterial sinusitis. No AOM on exam. Centor 0- doubt strep. No meningismus. "lung pain" with coughing, no other chest pain, doubt ACS. PERC negative- doubt PE. Given patient's wheezing w/ tobacco abuse will cover with prednisone burst & inhaler. Will also provide flonase to help with nasal congestion/edema. Primary care follow up. Tobacco cessation counseling was performed for approximately 5 minutes. I discussed results, treatment plan, need for follow-up, and return precautions with the patient. Provided opportunity for questions, patient confirmed  understanding and is in agreement with plan.    Final Clinical Impressions(s) / ED Diagnoses   Final diagnoses:  Cough  Tobacco abuse    ED Discharge Orders         Ordered    predniSONE (DELTASONE) 50 MG tablet  Daily with breakfast     07/04/18 1833    fluticasone (FLONASE) 50 MCG/ACT nasal spray  Daily     07/04/18 1833           Amaryllis Dyke, PA-C 07/04/18 1840    Veryl Speak, MD 07/04/18 2245

## 2018-07-04 NOTE — Discharge Instructions (Addendum)
You were seen in the ER today for cough.  Your chest x-ray was normal.  We are sending you home with an inhaler, steroids, and Flonase (a nasal spray). Please use the inhaler 1 to 2 puffs every 4-6 hours as needed for shortness of breath/wheezing.  Please take the steroids daily for 5 days to help with inflammation and to help with the air movement throughout your lungs.  Please use Flonase 1 spray per nostril daily for the next 1 week to help with congestion.   We have prescribed you new medication(s) today. Discuss the medications prescribed today with your pharmacist as they can have adverse effects and interactions with your other medicines including over the counter and prescribed medications. Seek medical evaluation if you start to experience new or abnormal symptoms after taking one of these medicines, seek care immediately if you start to experience difficulty breathing, feeling of your throat closing, facial swelling, or rash as these could be indications of a more serious allergic reaction  Please follow-up with primary care within 3 days.  If you do not have a primary care provider please call the phone number circled in your discharge instructions.  Return to the ER for new or worsening symptoms or any other concerns.  Please discontinue tobacco use/vaping as it is harmful to your lungs.

## 2018-07-04 NOTE — ED Triage Notes (Signed)
Pt states "I've been vaping hard for the last 90 days". C/o cough, SOB and chills that won't go away.

## 2018-10-10 ENCOUNTER — Other Ambulatory Visit: Payer: Self-pay | Admitting: *Deleted

## 2018-10-10 DIAGNOSIS — Z20822 Contact with and (suspected) exposure to covid-19: Secondary | ICD-10-CM

## 2018-10-13 LAB — NOVEL CORONAVIRUS, NAA: SARS-CoV-2, NAA: NOT DETECTED

## 2020-05-18 IMAGING — CR DG CHEST 2V
2 series · 2 of 2 positions shown · non-contrast
Comparison: Thoracic spine radiographs 03/23/2016

CLINICAL DATA: Cough and dyspnea.  History of vaping for [AGE].

EXAM:
CHEST - 2 VIEW

[w chest pa]
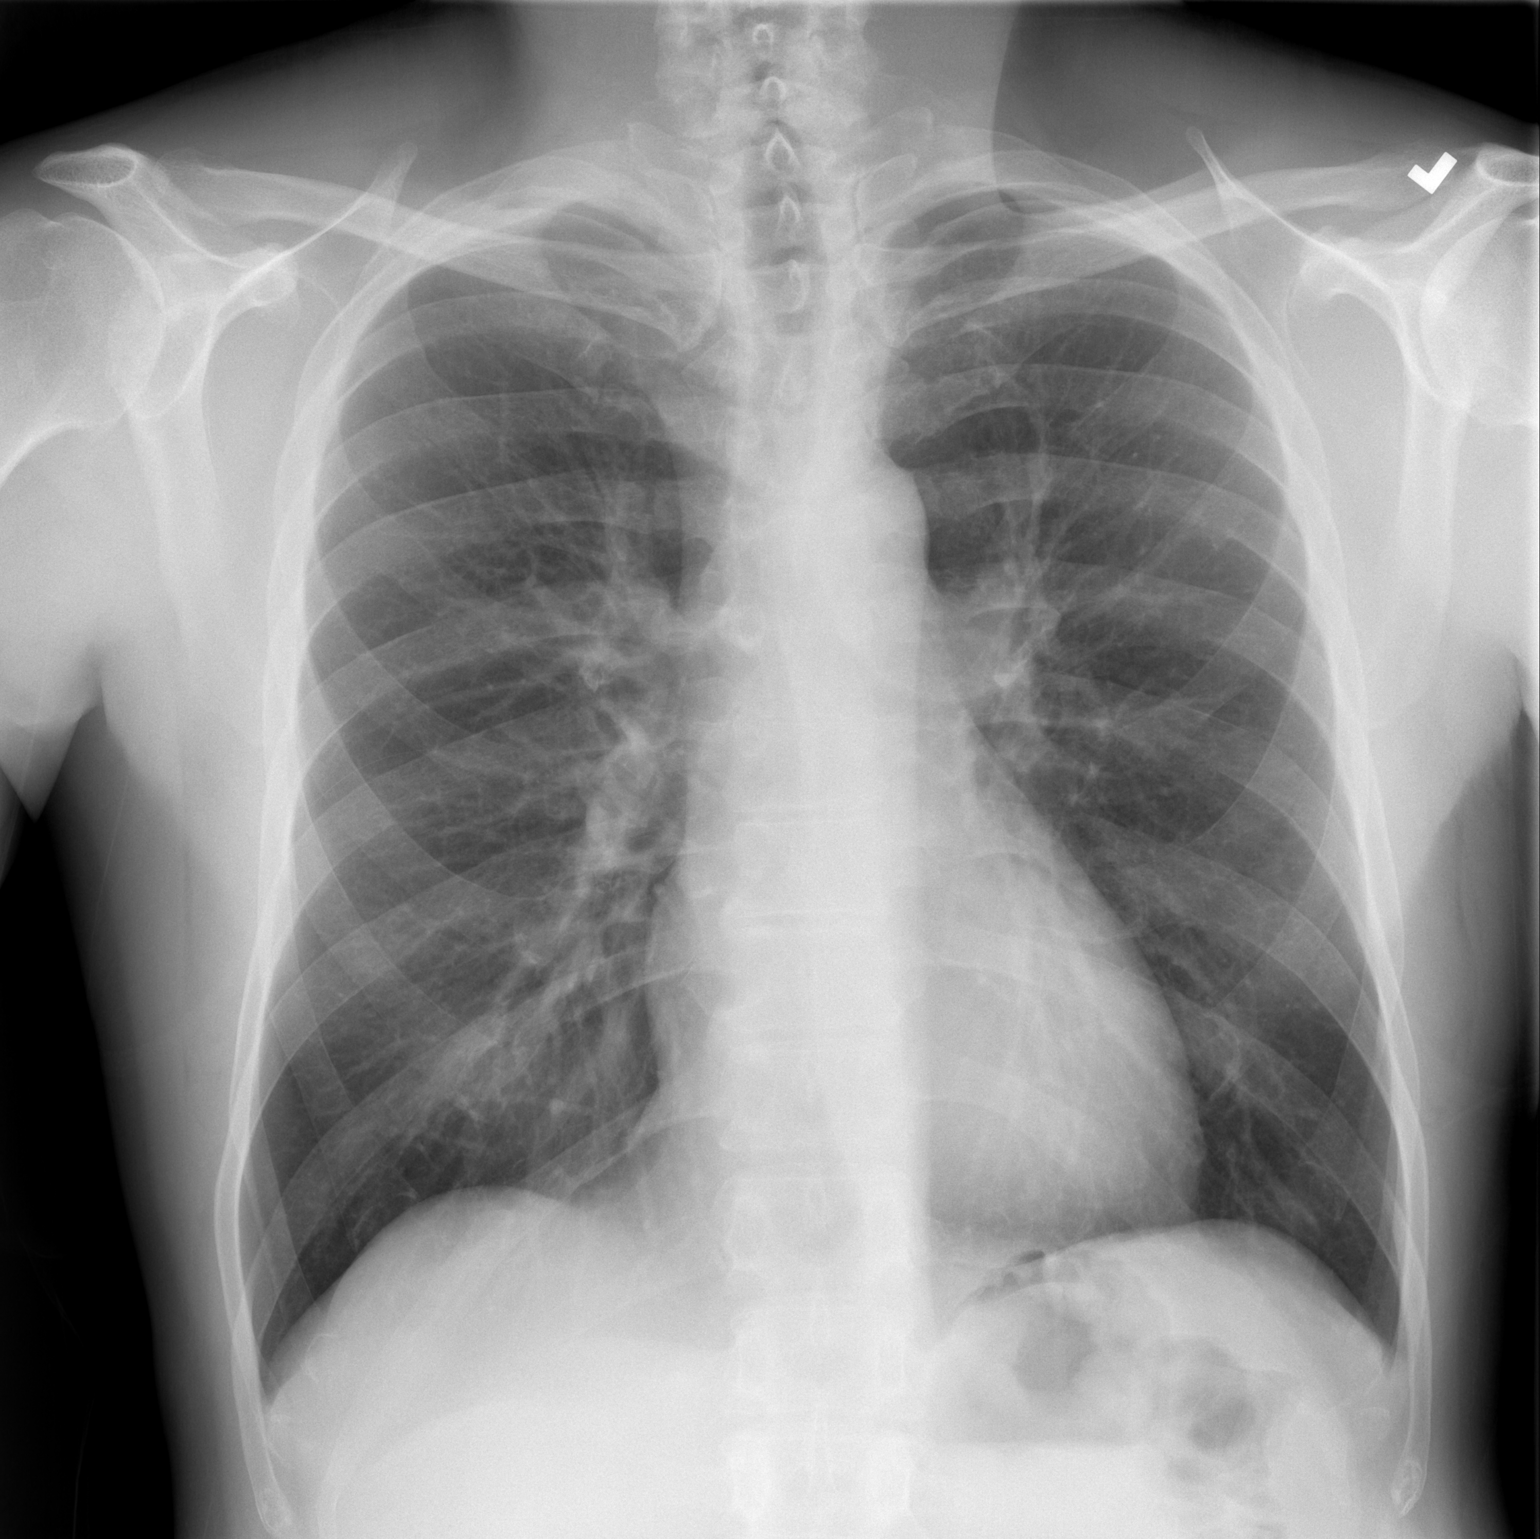

[w chest lat]
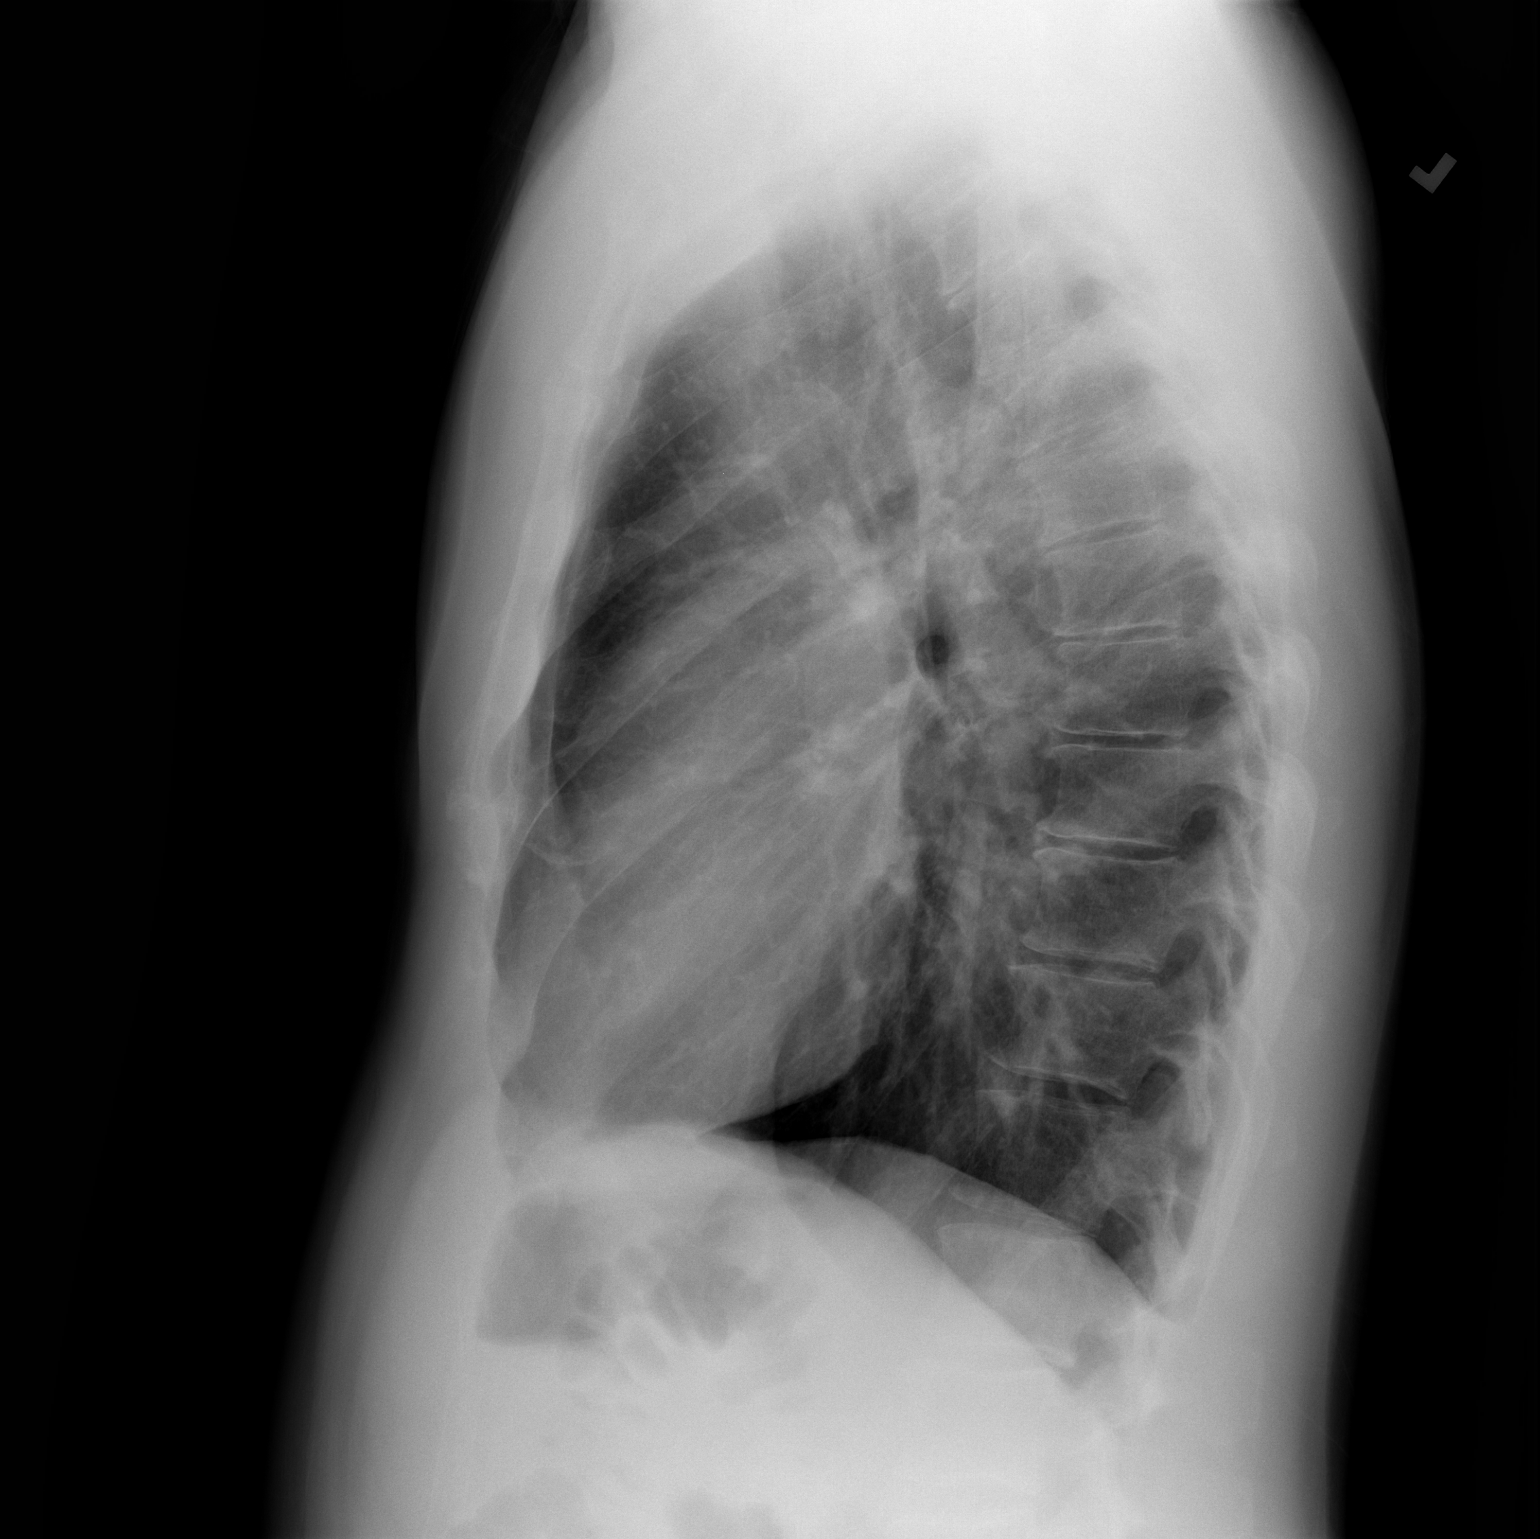

[2 of 2 positions shown; findings below may reference images not displayed]

FINDINGS: The heart size and mediastinal contours are within normal limits.
Both lungs are clear. Opacity at the right lung base is likely due
to overlap of pulmonary vasculature and ribs. The visualized
skeletal structures are unremarkable.
IMPRESSION: No active cardiopulmonary disease.
